# Patient Record
Sex: Female | Born: 1955 | Race: White | Hispanic: No | Marital: Single | Smoking: Never smoker
Health system: Southern US, Community
[De-identification: ages and names within clinical notes are randomized; demographics above are authoritative.]

## PROBLEM LIST (undated history)

## (undated) DIAGNOSIS — M1990 Unspecified osteoarthritis, unspecified site: Secondary | ICD-10-CM

## (undated) DIAGNOSIS — F329 Major depressive disorder, single episode, unspecified: Secondary | ICD-10-CM

## (undated) DIAGNOSIS — J449 Chronic obstructive pulmonary disease, unspecified: Secondary | ICD-10-CM

## (undated) DIAGNOSIS — F419 Anxiety disorder, unspecified: Secondary | ICD-10-CM

## (undated) DIAGNOSIS — I10 Essential (primary) hypertension: Secondary | ICD-10-CM

## (undated) DIAGNOSIS — G8929 Other chronic pain: Secondary | ICD-10-CM

## (undated) DIAGNOSIS — F32A Depression, unspecified: Secondary | ICD-10-CM

## (undated) DIAGNOSIS — M545 Low back pain, unspecified: Secondary | ICD-10-CM

## (undated) HISTORY — PX: BACK SURGERY: SHX140

## (undated) HISTORY — DX: Major depressive disorder, single episode, unspecified: F32.9

## (undated) HISTORY — PX: ABDOMINAL HYSTERECTOMY: SHX81

## (undated) HISTORY — DX: Depression, unspecified: F32.A

## (undated) HISTORY — DX: Other chronic pain: G89.29

## (undated) HISTORY — PX: COLPORRHAPHY: SHX921

## (undated) HISTORY — DX: Low back pain, unspecified: M54.50

## (undated) HISTORY — DX: Low back pain: M54.5

## (undated) HISTORY — DX: Unspecified osteoarthritis, unspecified site: M19.90

## (undated) HISTORY — DX: Anxiety disorder, unspecified: F41.9

## (undated) HISTORY — DX: Chronic obstructive pulmonary disease, unspecified: J44.9

## (undated) HISTORY — DX: Essential (primary) hypertension: I10

## (undated) HISTORY — PX: APPENDECTOMY: SHX54

---

## 2000-12-21 ENCOUNTER — Emergency Department (HOSPITAL_COMMUNITY): Admission: EM | Admit: 2000-12-21 | Discharge: 2000-12-22 | Payer: Self-pay | Admitting: Emergency Medicine

## 2000-12-21 ENCOUNTER — Encounter: Payer: Self-pay | Admitting: Emergency Medicine

## 2009-03-16 ENCOUNTER — Emergency Department (HOSPITAL_COMMUNITY): Admission: EM | Admit: 2009-03-16 | Discharge: 2009-03-16 | Payer: Self-pay | Admitting: Emergency Medicine

## 2013-04-24 ENCOUNTER — Ambulatory Visit (INDEPENDENT_AMBULATORY_CARE_PROVIDER_SITE_OTHER): Payer: Medicare Other | Admitting: Urology

## 2013-04-24 ENCOUNTER — Ambulatory Visit: Payer: Self-pay | Admitting: Urology

## 2013-04-24 DIAGNOSIS — N3946 Mixed incontinence: Secondary | ICD-10-CM

## 2013-04-24 DIAGNOSIS — N952 Postmenopausal atrophic vaginitis: Secondary | ICD-10-CM

## 2013-04-24 DIAGNOSIS — R350 Frequency of micturition: Secondary | ICD-10-CM

## 2013-04-24 DIAGNOSIS — R3989 Other symptoms and signs involving the genitourinary system: Secondary | ICD-10-CM

## 2013-04-24 DIAGNOSIS — R351 Nocturia: Secondary | ICD-10-CM

## 2013-06-02 ENCOUNTER — Encounter: Payer: Self-pay | Admitting: *Deleted

## 2013-06-11 ENCOUNTER — Encounter: Payer: Self-pay | Admitting: *Deleted

## 2013-06-11 ENCOUNTER — Encounter: Payer: Self-pay | Admitting: Obstetrics and Gynecology

## 2014-07-07 ENCOUNTER — Encounter (HOSPITAL_COMMUNITY): Payer: Self-pay | Admitting: Emergency Medicine

## 2014-07-07 ENCOUNTER — Emergency Department (HOSPITAL_COMMUNITY)
Admission: EM | Admit: 2014-07-07 | Discharge: 2014-07-07 | Disposition: A | Discharge status: Home / Self Care | Payer: Medicare Other | Attending: Emergency Medicine | Admitting: Emergency Medicine

## 2014-07-07 ENCOUNTER — Emergency Department (HOSPITAL_COMMUNITY): Payer: Medicare Other

## 2014-07-07 DIAGNOSIS — Z79899 Other long term (current) drug therapy: Secondary | ICD-10-CM | POA: Insufficient documentation

## 2014-07-07 DIAGNOSIS — F329 Major depressive disorder, single episode, unspecified: Secondary | ICD-10-CM | POA: Insufficient documentation

## 2014-07-07 DIAGNOSIS — W1809XA Striking against other object with subsequent fall, initial encounter: Secondary | ICD-10-CM | POA: Diagnosis not present

## 2014-07-07 DIAGNOSIS — S0993XA Unspecified injury of face, initial encounter: Secondary | ICD-10-CM | POA: Insufficient documentation

## 2014-07-07 DIAGNOSIS — S0181XA Laceration without foreign body of other part of head, initial encounter: Secondary | ICD-10-CM

## 2014-07-07 DIAGNOSIS — W010XXA Fall on same level from slipping, tripping and stumbling without subsequent striking against object, initial encounter: Secondary | ICD-10-CM | POA: Insufficient documentation

## 2014-07-07 DIAGNOSIS — S0180XA Unspecified open wound of other part of head, initial encounter: Secondary | ICD-10-CM | POA: Diagnosis not present

## 2014-07-07 DIAGNOSIS — Y9389 Activity, other specified: Secondary | ICD-10-CM | POA: Insufficient documentation

## 2014-07-07 DIAGNOSIS — S8000XA Contusion of unspecified knee, initial encounter: Secondary | ICD-10-CM | POA: Insufficient documentation

## 2014-07-07 DIAGNOSIS — Y929 Unspecified place or not applicable: Secondary | ICD-10-CM | POA: Diagnosis not present

## 2014-07-07 DIAGNOSIS — G8929 Other chronic pain: Secondary | ICD-10-CM | POA: Diagnosis not present

## 2014-07-07 DIAGNOSIS — J449 Chronic obstructive pulmonary disease, unspecified: Secondary | ICD-10-CM | POA: Diagnosis not present

## 2014-07-07 DIAGNOSIS — J4489 Other specified chronic obstructive pulmonary disease: Secondary | ICD-10-CM | POA: Insufficient documentation

## 2014-07-07 DIAGNOSIS — F3289 Other specified depressive episodes: Secondary | ICD-10-CM | POA: Diagnosis not present

## 2014-07-07 DIAGNOSIS — M129 Arthropathy, unspecified: Secondary | ICD-10-CM | POA: Insufficient documentation

## 2014-07-07 DIAGNOSIS — F411 Generalized anxiety disorder: Secondary | ICD-10-CM | POA: Insufficient documentation

## 2014-07-07 DIAGNOSIS — I10 Essential (primary) hypertension: Secondary | ICD-10-CM | POA: Insufficient documentation

## 2014-07-07 DIAGNOSIS — S199XXA Unspecified injury of neck, initial encounter: Secondary | ICD-10-CM

## 2014-07-07 DIAGNOSIS — W19XXXA Unspecified fall, initial encounter: Secondary | ICD-10-CM

## 2014-07-07 MED ORDER — HYDROMORPHONE HCL PF 1 MG/ML IJ SOLN
1.0000 mg | Freq: Once | INTRAMUSCULAR | Status: AC
  Administered 2014-07-07: 1 mg via INTRAMUSCULAR
  Filled 2014-07-07: qty 1

## 2014-07-07 MED ORDER — IBUPROFEN 600 MG PO TABS: 600.0000 mg | ORAL_TABLET | Freq: Three times a day (TID) | ORAL | Status: AC | PRN

## 2014-07-07 NOTE — ED Notes (Signed)
PA at bedside.

## 2014-07-07 NOTE — ED Provider Notes (Signed)
CSN: [id number]     Arrival date & time 07/07/14  1226 History   First MD Initiated Contact with Patient 07/07/14 1516     Chief Complaint  Patient presents with  . Fall  . Laceration     (Consider location/radiation/quality/duration/timing/severity/associated sxs/prior Treatment) HPI Comments: Christine Galvan is a 58 y.o. female with a PMHx of anxiety, arthritis, COPD, depression, HTN, chronic back pain on Opana, who presents today with complaints of a fall around noon today. She states that she was in the hallway leaving the hospital after visiting her daughter who is in the ICU, and states that she fell after tripping, and hit her face on the floor as well as both knees. She states that she now has a headache and a laceration to her for head with knee soreness. She states that the pain is moderate in severity, constant, achy, nonradiating, with no known aggravating or alleviating factors. She is on any blood thinning medications. She denies any loss of consciousness. She states that she was ambulatory after the event. Denies any neck pain or back pain, chest pain, rhinorrhea, ear pain, abdominal pain, nausea, vomiting, vertigo, vision changes, or weakness. Denies any cauda equina symptoms. Denies feeling lightheaded at the time. States that she has been "running herself ragged" with her daughter in the hospital. Her last dose of Opana was at 7 AM this morning. She is up-to-date on her tetanus shot.  Patient is a 58 y.o. female presenting with fall and skin laceration. The history is provided by the patient. No language interpreter was used.  Fall This is a new problem. The current episode started today. The problem has been unchanged. Associated symptoms include arthralgias (L knee) and headaches. Pertinent negatives include no abdominal pain, chest pain, fever, joint swelling, myalgias, nausea, neck pain, numbness, vertigo, visual change, vomiting or weakness. Nothing aggravates the symptoms. She  has tried nothing for the symptoms. The treatment provided no relief.  Laceration   Past Medical History  Diagnosis Date  . Anxiety   . Arthritis   . COPD (chronic obstructive pulmonary disease)   . Depression   . Hypertension   . Lumbago   . Chronic pain    Past Surgical History  Procedure Laterality Date  . Colporrhaphy      repair of cystocele  . Appendectomy    . Back surgery    . Cesarean section    . Abdominal hysterectomy     No family history on file. History  Substance Use Topics  . Smoking status: Never Smoker   . Smokeless tobacco: Never Used  . Alcohol Use: No   OB History   Grav Para Term Preterm Abortions TAB SAB Ect Mult Living                 Review of Systems  Constitutional: Negative for fever.  HENT: Positive for facial swelling. Negative for drooling, ear discharge, ear pain, hearing loss, rhinorrhea and trouble swallowing.   Eyes: Negative for photophobia and visual disturbance.  Respiratory: Negative for shortness of breath.   Cardiovascular: Negative for chest pain.  Gastrointestinal: Negative for nausea, vomiting and abdominal pain.  Musculoskeletal: Positive for arthralgias (L knee). Negative for back pain, gait problem, joint swelling, myalgias, neck pain and neck stiffness.  Skin: Positive for wound.  Neurological: Positive for headaches. Negative for dizziness, vertigo, syncope, weakness, light-headedness and numbness.  Hematological: Does not bruise/bleed easily.  Psychiatric/Behavioral: Negative for confusion.  10 Systems reviewed and are negative for  acute change except as noted in the HPI.     Allergies  Bee venom  Home Medications   Prior to Admission medications   Medication Sig Start Date End Date Taking? Authorizing Provider  albuterol (PROVENTIL HFA;VENTOLIN HFA) 108 (90 BASE) MCG/ACT inhaler Inhale 1 puff into the lungs every 6 (six) hours as needed for wheezing or shortness of breath.   Yes Historical Provider, MD    ALPRAZolam Prudy Feeler(XANAX) 1 MG tablet Take 1 mg by mouth 3 (three) times daily as needed for anxiety.   Yes Historical Provider, MD  EPINEPHrine (EPIPEN 2-PAK IJ) Inject 1 Applicatorful as directed daily as needed (allergic reaction).    Yes Historical Provider, MD  lisinopril (PRINIVIL,ZESTRIL) 10 MG tablet Take 10 mg by mouth daily.   Yes Historical Provider, MD  oxymorphone (OPANA ER) 40 MG 12 hr tablet Take 40 mg by mouth every 12 (twelve) hours.   Yes Historical Provider, MD  Vilazodone HCl (VIIBRYD) 40 MG TABS Take 40 mg by mouth daily.   Yes Historical Provider, MD  ibuprofen (ADVIL,MOTRIN) 600 MG tablet Take 1 tablet (600 mg total) by mouth every 8 (eight) hours as needed for fever, headache, moderate pain or cramping. 07/07/14   Martha Soltys Strupp Camprubi-Soms, PA-C   BP 124/71  Pulse 66  Temp(Src) 98.1 F (36.7 C) (Oral)  Resp 20  Wt 234 lb (106.142 kg)  SpO2 95% Physical Exam  Nursing note and vitals reviewed. Constitutional: She is oriented to person, place, and time. Vital signs are normal. She appears well-developed and well-nourished. No distress.  VSS  HENT:  Head: Normocephalic. Head is with laceration.    Mouth/Throat: Oropharynx is clear and moist and mucous membranes are normal. Normal dentition.  Laceration approx 0.5cm in length across central forehead with surrounding swelling and erythema, bleeding controlled with pressure. No bony deformity or crepitus. Nose clear Mouth clear without dentitia looseness, poor oral dentitia at baseline  Eyes: Conjunctivae and EOM are normal. Pupils are equal, round, and reactive to light. Right eye exhibits no discharge. Left eye exhibits no discharge.  PERRL, EOMI, conjunctiva noninjected  Neck: Normal range of motion. Neck supple. No spinous process tenderness and no muscular tenderness present. No rigidity. Normal range of motion present.  FROM intact without any spinous process or paracervical muscle tenderness to palpation. No  meningeal signs.  Cardiovascular: Normal rate, regular rhythm, normal heart sounds and intact distal pulses.  Exam reveals no gallop and no friction rub.   No murmur heard. Pulmonary/Chest: Effort normal and breath sounds normal. No respiratory distress. She has no decreased breath sounds. She has no wheezes. She has no rhonchi. She has no rales.  Abdominal: Soft. Normal appearance and bowel sounds are normal. She exhibits no distension. There is no tenderness. There is no rigidity, no rebound and no guarding.  Musculoskeletal:       Left knee: She exhibits decreased range of motion, swelling and erythema (trace, over patella). She exhibits no effusion, no deformity, no laceration, normal alignment, no LCL laxity, normal patellar mobility and no MCL laxity. Tenderness found.  Left knee with mild swelling over patella, and trace erythema over this area with no abrasions or lacerations. Tender to palpation over the patella. No abnormal patellar mobility or alignment. No patellar ballottement. No laxity with varus or valgus stress. Range of motion limited secondary to pain. Sensations grossly intact all extremities. Strength 5/5 in all extremities. Right knee without swelling, decreased range of motion, tenderness, or deformity. There is a small  area of erythema over the patella where she landed on, but no lacerations or abrasions.  Neurological: She is alert and oriented to person, place, and time. She has normal strength. No cranial nerve deficit or sensory deficit.  CN 2-12 grossly intact without cerebellar signs. Strength 5/5 in all extremities, sensation grossly intact.  Skin: Skin is warm and dry. Laceration noted. No rash noted. There is erythema.     Laceration approx 0.5cm across central forehead. Erythema to b/l knees over patellas  Psychiatric: She has a normal mood and affect.    ED Course  LACERATION REPAIR Date/Time: 07/07/2014 4:20 PM Performed by: CAMPRUBI-SOMS, Kaiulani Sitton  STRUPP Authorized by: Ramond Marrow Consent: Verbal consent obtained. Risks and benefits: risks, benefits and alternatives were discussed Consent given by: patient Patient understanding: patient states understanding of the procedure being performed Patient consent: the patient's understanding of the procedure matches consent given Patient identity confirmed: verbally with patient Body area: head/neck Location details: forehead Laceration length: 0.5 cm Foreign bodies: no foreign bodies Tendon involvement: none Nerve involvement: none Vascular damage: no Preparation: Patient was prepped and draped in the usual sterile fashion. Irrigation solution: saline Irrigation method: syringe Amount of cleaning: standard Debridement: none Degree of undermining: none Skin closure: glue and Steri-Strips Approximation: close Approximation difficulty: simple Patient tolerance: Patient tolerated the procedure well with no immediate complications.   (including critical care time) Labs Review Labs Reviewed - No data to display  Imaging Review Ct Head Wo Contrast  07/07/2014   CLINICAL DATA:  Tripped and fell. Laceration to forehead and nose. Headache and soreness to face.  EXAM: CT HEAD WITHOUT CONTRAST  CT MAXILLOFACIAL WITHOUT CONTRAST  CT CERVICAL SPINE WITHOUT CONTRAST  TECHNIQUE: Multidetector CT imaging of the head, cervical spine, and maxillofacial structures were performed using the standard protocol without intravenous contrast. Multiplanar CT image reconstructions of the cervical spine and maxillofacial structures were also generated.  COMPARISON:  None.  FINDINGS: CT HEAD FINDINGS  There is no evidence for acute hemorrhage, hydrocephalus, mass lesion, or abnormal extra-axial fluid collection. No definite CT evidence for acute infarction. The visualized paranasal sinuses and mastoid air cells are clear. Soft tissue gas in the frontal scalp consistent with the reported history  of laceration.  CT MAXILLOFACIAL FINDINGS  There is no evidence for facial bone fracture. The nasal bones are intact. No medial or inferior orbital wall fracture. No maxillary sinus fracture. Zygomatic arches are intact. The mandible is intact in the temporomandibular joints are located. No air-fluid levels in the sphenoid sinuses. No evidence for fluid in the mastoid air cells.  CT CERVICAL SPINE FINDINGS  Imaging was obtained from the skullbase through the T2-3 interspace. There is no evidence for fracture. No subluxation. Intervertebral disc spaces are preserved. Facets are well aligned bilaterally. Left-sided foraminal encroachment is seen at C3-4 secondary to uncinate spurring and facet over growth. There is fairly diffuse facet arthropathy bilaterally. No prevertebral soft tissue swelling. Slight tonsillar asymmetry noted. Straightening of the normal cervical lordosis is evident.  IMPRESSION: 1. No acute intracranial abnormality. Normal CT evaluation of the brain. 2. Tiny gas bubbles in the frontal scalp consistent with the reported history of laceration. 3. No evidence for facial bone fracture. 4. Degenerative changes in the cervical spine without fracture.   Electronically Signed   By: Kennith Center M.D.   On: 07/07/2014 14:05   Ct Cervical Spine Wo Contrast  07/07/2014   CLINICAL DATA:  Tripped and fell. Laceration to forehead and nose. Headache  and soreness to face.  EXAM: CT HEAD WITHOUT CONTRAST  CT MAXILLOFACIAL WITHOUT CONTRAST  CT CERVICAL SPINE WITHOUT CONTRAST  TECHNIQUE: Multidetector CT imaging of the head, cervical spine, and maxillofacial structures were performed using the standard protocol without intravenous contrast. Multiplanar CT image reconstructions of the cervical spine and maxillofacial structures were also generated.  COMPARISON:  None.  FINDINGS: CT HEAD FINDINGS  There is no evidence for acute hemorrhage, hydrocephalus, mass lesion, or abnormal extra-axial fluid collection. No  definite CT evidence for acute infarction. The visualized paranasal sinuses and mastoid air cells are clear. Soft tissue gas in the frontal scalp consistent with the reported history of laceration.  CT MAXILLOFACIAL FINDINGS  There is no evidence for facial bone fracture. The nasal bones are intact. No medial or inferior orbital wall fracture. No maxillary sinus fracture. Zygomatic arches are intact. The mandible is intact in the temporomandibular joints are located. No air-fluid levels in the sphenoid sinuses. No evidence for fluid in the mastoid air cells.  CT CERVICAL SPINE FINDINGS  Imaging was obtained from the skullbase through the T2-3 interspace. There is no evidence for fracture. No subluxation. Intervertebral disc spaces are preserved. Facets are well aligned bilaterally. Left-sided foraminal encroachment is seen at C3-4 secondary to uncinate spurring and facet over growth. There is fairly diffuse facet arthropathy bilaterally. No prevertebral soft tissue swelling. Slight tonsillar asymmetry noted. Straightening of the normal cervical lordosis is evident.  IMPRESSION: 1. No acute intracranial abnormality. Normal CT evaluation of the brain. 2. Tiny gas bubbles in the frontal scalp consistent with the reported history of laceration. 3. No evidence for facial bone fracture. 4. Degenerative changes in the cervical spine without fracture.   Electronically Signed   By: Kennith Center M.D.   On: 07/07/2014 14:05   Dg Knee Complete 4 Views Left  07/07/2014   CLINICAL DATA:  Pain after a fall.  Lacerations.  EXAM: LEFT KNEE - COMPLETE 4+ VIEW  COMPARISON:  None.  FINDINGS: No acute osseous or joint abnormality. Small radiopaque structure is seen along the lateral aspect of the distal femur. Mild medial compartment osteophytosis.  IMPRESSION: 1. No acute osseous or joint abnormality. 2. Radiopaque structure in the soft tissues lateral to the distal femur may be pre-existing. 3. Trace medial compartment  osteophytosis.   Electronically Signed   By: Leanna Battles M.D.   On: 07/07/2014 13:20   Dg Knee Complete 4 Views Right  07/07/2014   CLINICAL DATA:  Fall and laceration.  EXAM: RIGHT KNEE - COMPLETE 4+ VIEW  COMPARISON:  None.  FINDINGS: Negative for a fracture or dislocation. No significant joint effusion. Alignment of the right knee is normal. Mild degenerative changes in the patellofemoral compartment and medial knee compartment.  IMPRESSION: No acute bone abnormality in the right knee.   Electronically Signed   By: Richarda Overlie M.D.   On: 07/07/2014 13:22   Ct Maxillofacial Wo Cm  07/07/2014   CLINICAL DATA:  Tripped and fell. Laceration to forehead and nose. Headache and soreness to face.  EXAM: CT HEAD WITHOUT CONTRAST  CT MAXILLOFACIAL WITHOUT CONTRAST  CT CERVICAL SPINE WITHOUT CONTRAST  TECHNIQUE: Multidetector CT imaging of the head, cervical spine, and maxillofacial structures were performed using the standard protocol without intravenous contrast. Multiplanar CT image reconstructions of the cervical spine and maxillofacial structures were also generated.  COMPARISON:  None.  FINDINGS: CT HEAD FINDINGS  There is no evidence for acute hemorrhage, hydrocephalus, mass lesion, or abnormal extra-axial fluid collection.  No definite CT evidence for acute infarction. The visualized paranasal sinuses and mastoid air cells are clear. Soft tissue gas in the frontal scalp consistent with the reported history of laceration.  CT MAXILLOFACIAL FINDINGS  There is no evidence for facial bone fracture. The nasal bones are intact. No medial or inferior orbital wall fracture. No maxillary sinus fracture. Zygomatic arches are intact. The mandible is intact in the temporomandibular joints are located. No air-fluid levels in the sphenoid sinuses. No evidence for fluid in the mastoid air cells.  CT CERVICAL SPINE FINDINGS  Imaging was obtained from the skullbase through the T2-3 interspace. There is no evidence for  fracture. No subluxation. Intervertebral disc spaces are preserved. Facets are well aligned bilaterally. Left-sided foraminal encroachment is seen at C3-4 secondary to uncinate spurring and facet over growth. There is fairly diffuse facet arthropathy bilaterally. No prevertebral soft tissue swelling. Slight tonsillar asymmetry noted. Straightening of the normal cervical lordosis is evident.  IMPRESSION: 1. No acute intracranial abnormality. Normal CT evaluation of the brain. 2. Tiny gas bubbles in the frontal scalp consistent with the reported history of laceration. 3. No evidence for facial bone fracture. 4. Degenerative changes in the cervical spine without fracture.   Electronically Signed   By: Kennith Center M.D.   On: 07/07/2014 14:05     EKG Interpretation None      MDM   Final diagnoses:  Facial laceration, initial encounter  Knee contusion, unspecified laterality, initial encounter  Fall, initial encounter     58y/o female here s/p fall in hallway. Tripped trying to keep up with family members. No LOC. CT head, face, and neck obtained and were neg. Xrays bilateral knees neg. Will give 1mg  IM dilaudid since pt is chronic pain pt and has not taken regular dose of opana this afternoon, and has had a fall. Will fix lac with dermabond and place steristrips over small nonbleeding area next to lac. D/c home with instructions to use tylenol or motrin for pain in addition to her home opana medication. Discussed proper care of dermabond, and s/sx of infection to watch for. Discussed f/up in 5 days with PCP for wound check. I explained the diagnosis and have given explicit precautions to return to the ER including for any other new or worsening symptoms. The patient understands and accepts the medical plan as it's been dictated and I have answered their questions. Discharge instructions concerning home care and prescriptions have been given. The patient is STABLE and is discharged to home in good  condition.  BP 124/71  Pulse 66  Temp(Src) 98.1 F (36.7 C) (Oral)  Resp 20  Wt 234 lb (106.142 kg)  SpO2 95%  Meds ordered this encounter  Medications  . HYDROmorphone (DILAUDID) injection 1 mg    Sig:   . ibuprofen (ADVIL,MOTRIN) 600 MG tablet    Sig: Take 1 tablet (600 mg total) by mouth every 8 (eight) hours as needed for fever, headache, moderate pain or cramping.    Dispense:  30 tablet    Refill:  0    Order Specific Question:  Supervising Provider    Answer:  Vida Roller [address] Camprubi-Soms, PA-C 07/07/14 1645

## 2014-07-07 NOTE — ED Notes (Addendum)
Pt presents to department for evaluation of fall. Pt here visiting family member, states she tripped and fell in hallway. Lacerations noted to forehead and nose, bleeding controlled. Also reports knee redness and pain. Pt is alert and oriented x4. Denies LOC. Pt reports headache and soreness to face.

## 2014-07-07 NOTE — Discharge Instructions (Signed)
Keep wound and clean with mild soap and water but do not get wet for the next 24 hours. Keep area covered with a bandage, keep bandage dry, and do not submerge in water for 24 hours. DO NOT USE OINTMENTS ON THIS AREA. Ice for additional pain relief and swelling. Alternate between Ibuprofen and Tylenol for additional pain relief as well as your home pain medications. [facility] up with your primary care doctor or [facility] in approximately 5-7 days for wound recheck. Monitor area for signs of infection to include, but not limited to: increasing pain, redness, drainage/pus, or swelling. Return to emergency department for emergent changing or worsening symptoms.   Facial Laceration A facial laceration is a cut on the face. These injuries can be painful and cause bleeding. Some cuts may need to be closed with stitches (sutures), skin adhesive strips, or wound glue. Cuts usually heal quickly but can leave a scar. It can take 1-2 years for the scar to go away completely. HOME CARE   Only take medicines as told by your doctor.  Follow your doctor's instructions for wound care. For Stitches:  Keep the cut clean and dry.  If you have a bandage (dressing), change it at least once a day. Change the bandage if it gets wet or dirty, or as told by your doctor.  Wash the cut with soap and water 2 times a day. Rinse the cut with water. Pat it dry with a clean towel.  Put a thin layer of medicated cream on the cut as told by your doctor.  You may shower after the first 24 hours. Do not soak the cut in water until the stitches are removed.  Have your stitches removed as told by your doctor.  Do not wear any makeup until a few days after your stitches are removed. For Skin Adhesive Strips:  Keep the cut clean and dry.  Do not get the strips wet. You may take a bath, but be careful to keep the cut dry.  If the cut gets wet, pat it dry with a clean towel.  The strips will fall off on  their own. Do not remove the strips that are still stuck to the cut. For Wound Glue:  You may shower or take baths. Do not soak or scrub the cut. Do not swim. Avoid heavy sweating until the glue falls off on its own. After a shower or bath, pat the cut dry with a clean towel.  Do not put medicine or makeup on your cut until the glue falls off.  If you have a bandage, do not put tape over the glue.  Avoid lots of sunlight or tanning lamps until the glue falls off.  The glue will fall off on its own in 5-10 days. Do not pick at the glue. After Healing: Put sunscreen on the cut for the first year to reduce your scar. GET HELP RIGHT AWAY IF:   Your cut area gets red, painful, or puffy (swollen).  You see a yellowish-white fluid (pus) coming from the cut.  You have chills or a fever. MAKE SURE YOU:   Understand these instructions.  Will watch your condition.  Will get help right away if you are not doing well or get worse. Document Released: 04/23/2008 Document Revised: 08/26/2013 Document Reviewed: 06/18/2013 [facility] Patient Information 2015 [place]. This information is not intended to replace advice given to you by your health care provider. Make sure you discuss any  questions you have with your health care provider.  Cryotherapy Cryotherapy means treatment with cold. Ice or gel packs can be used to reduce both pain and swelling. Ice is the most helpful within the first 24 to 48 hours after an injury or flare-up from overusing a muscle or joint. Sprains, strains, spasms, burning pain, shooting pain, and aches can all be eased with ice. Ice can also be used when recovering from surgery. Ice is effective, has very few side effects, and is safe for most people to use. PRECAUTIONS  Ice is not a safe treatment option for people with:  Raynaud phenomenon. This is a condition affecting small blood vessels in the extremities. Exposure to cold may cause your problems to  return.  Cold hypersensitivity. There are many forms of cold hypersensitivity, including:  Cold urticaria. Red, itchy hives appear on the skin when the tissues begin to warm after being iced.  Cold erythema. This is a red, itchy rash caused by exposure to cold.  Cold hemoglobinuria. Red blood cells break down when the tissues begin to warm after being iced. The hemoglobin that carry oxygen are passed into the urine because they cannot combine with blood proteins fast enough.  Numbness or altered sensitivity in the area being iced. If you have any of the following conditions, do not use ice until you have discussed cryotherapy with your caregiver:  Heart conditions, such as arrhythmia, angina, or chronic heart disease.  High blood pressure.  Healing wounds or open skin in the area being iced.  Current infections.  Rheumatoid arthritis.  Poor circulation.  Diabetes. Ice slows the blood flow in the region it is applied. This is beneficial when trying to stop inflamed tissues from spreading irritating chemicals to surrounding tissues. However, if you expose your skin to cold temperatures for too long or without the proper protection, you can damage your skin or nerves. Watch for signs of skin damage due to cold. HOME CARE INSTRUCTIONS Follow these tips to use ice and cold packs safely.  Place a dry or damp towel between the ice and skin. A damp towel will cool the skin more quickly, so you may need to shorten the time that the ice is used.  For a more rapid response, add gentle compression to the ice.  Ice for no more than 10 to 20 minutes at a time. The bonier the area you are icing, the less time it will take to get the benefits of ice.  Check your skin after 5 minutes to make sure there are no signs of a poor response to cold or skin damage.  Rest 20 minutes or more between uses.  Once your skin is numb, you can end your treatment. You can test numbness by very lightly touching  your skin. The touch should be so light that you do not see the skin dimple from the pressure of your fingertip. When using ice, most people will feel these normal sensations in this order: cold, burning, aching, and numbness.  Do not use ice on someone who cannot communicate their responses to pain, such as small children or people with dementia. HOW TO MAKE AN ICE PACK Ice packs are the most common way to use ice therapy. Other methods include ice massage, ice baths, and cryosprays. Muscle creams that cause a cold, tingly feeling do not offer the same benefits that ice offers and should not be used as a substitute unless recommended by your caregiver. To make an ice pack, do  one of the following:  Place crushed ice or a bag of frozen vegetables in a sealable plastic bag. Squeeze out the excess air. Place this bag inside another plastic bag. Slide the bag into a pillowcase or place a damp towel between your skin and the bag.  Mix 3 parts water with 1 part rubbing alcohol. Freeze the mixture in a sealable plastic bag. When you remove the mixture from the freezer, it will be slushy. Squeeze out the excess air. Place this bag inside another plastic bag. Slide the bag into a pillowcase or place a damp towel between your skin and the bag. SEEK MEDICAL CARE IF:  You develop white spots on your skin. This may give the skin a blotchy (mottled) appearance.  Your skin turns blue or pale.  Your skin becomes waxy or hard.  Your swelling gets worse. MAKE SURE YOU:   Understand these instructions.  Will watch your condition.  Will get help right away if you are not doing well or get worse. Document Released: 07/02/2011 Document Revised: 03/22/2014 Document Reviewed: 07/02/2011 [facility] Patient Information 2015 [place], [place]. This information is not intended to replace advice given to you by your health care provider. Make sure you discuss any questions you have with your health care  provider.  Stitches, Staples, or Skin Adhesive Strips  Stitches (sutures), staples, and skin adhesive strips hold the skin together as it heals. They will usually be in place for 7 days or less. HOME CARE  Wash your hands with soap and water before and after you touch your wound.  Only take medicine as told by your doctor.  Cover your wound only if your doctor told you to. Otherwise, leave it open to air.  Do not get your stitches wet or dirty. If they get dirty, dab them gently with a clean washcloth. Wet the washcloth with soapy water. Do not rub. Pat them dry gently.  Do not put medicine or medicated cream on your stitches unless your doctor told you to.  Do not take out your own stitches or staples. Skin adhesive strips will fall off by themselves.  Do not pick at the wound. Picking can cause an infection.  Do not miss your follow-up appointment.  If you have problems or questions, call your doctor. GET HELP RIGHT AWAY IF:   You have a temperature by mouth above 102 F (38.9 C), not controlled by medicine.  You have chills.  You have redness or pain around your stitches.  There is puffiness (swelling) around your stitches.  You notice fluid (drainage) from your stitches.  There is a bad smell coming from your wound. MAKE SURE YOU:  Understand these instructions.  Will watch your condition.  Will get help if you are not doing well or get worse. Document Released: 09/02/2009 Document Revised: 01/28/2012 Document Reviewed: 09/02/2009 [facility] Patient Information 2015 [place], [place]. This information is not intended to replace advice given to you by your health care provider. Make sure you discuss any questions you have with your health care provider.  Sutured Wound Care Sutures are stitches that can be used to close wounds. Caring for your wound can help stop infection and lessen pain. HOME CARE   Rest and raise (elevate) the injured area until the pain and  puffiness (swelling) go away.  Only take medicines as told by your doctor.  Clean the wound gently with mild soap and water once a day after the first 2 days. Rinse off the soap. Dennie Bible  the area dry with a clean towel. Do not rub the wound.  Change the bandage (dressing) as told by your doctor. If the bandage sticks, soak it off with soapy water. Stop using a bandage after 2 days or after the wound stops leaking fluid.  Put cream on the wound as told by your doctor.  Do not stretch the wound.  Drink enough fluids to keep your pee (urine) clear or pale yellow.  See your doctor to have the sutures removed.  Use sunscreen or sunblock on the wound after it heals. GET HELP RIGHT AWAY IF:   Your wound gets red, puffy, hot, or tender.  You have more pain in the wound.  You have a red streak that goes away from the wound.  You see yellowish-white fluid (pus) coming out of the wound.  You have a fever.  You have chills and start to shake.  You notice a bad smell coming from the wound.  Your wound will not stop bleeding. MAKE SURE YOU:   Understand these instructions.  Will watch your condition.  Will get help right away if you are not doing well or get worse. Document Released: 04/23/2008 Document Revised: 01/28/2012 Document Reviewed: 03/11/2011 [facility] Patient Information 2015 [place], [place]. This information is not intended to replace advice given to you by your health care provider. Make sure you discuss any questions you have with your health care provider.

## 2014-07-08 NOTE — ED Provider Notes (Signed)
Medical screening examination/treatment/procedure(s) were performed by non-physician practitioner and as supervising physician I was immediately available for consultation/collaboration.   Emit Kuenzel T Amena Dockham, MD 07/08/14 2250 

## 2015-08-10 IMAGING — CR DG KNEE COMPLETE 4+V*L*
4 series · 4 of 4 positions shown · non-contrast
Comparison: None.

CLINICAL DATA: Pain after a fall.  Lacerations.

EXAM:
LEFT KNEE - COMPLETE 4+ VIEW

[t knee ap left]
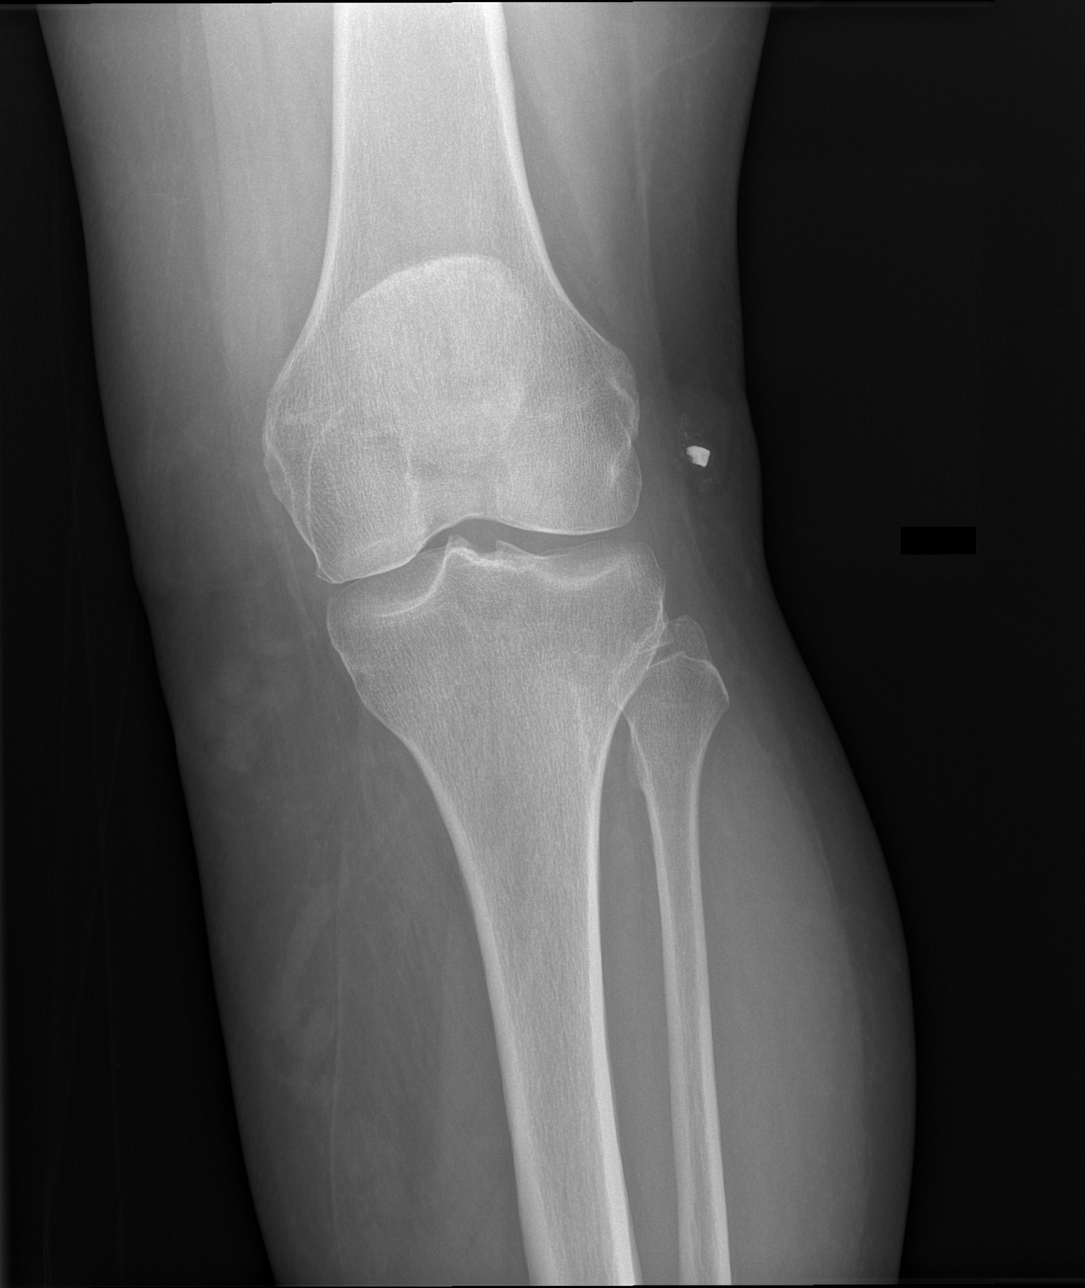

[t knee obl left (1 of 2)]
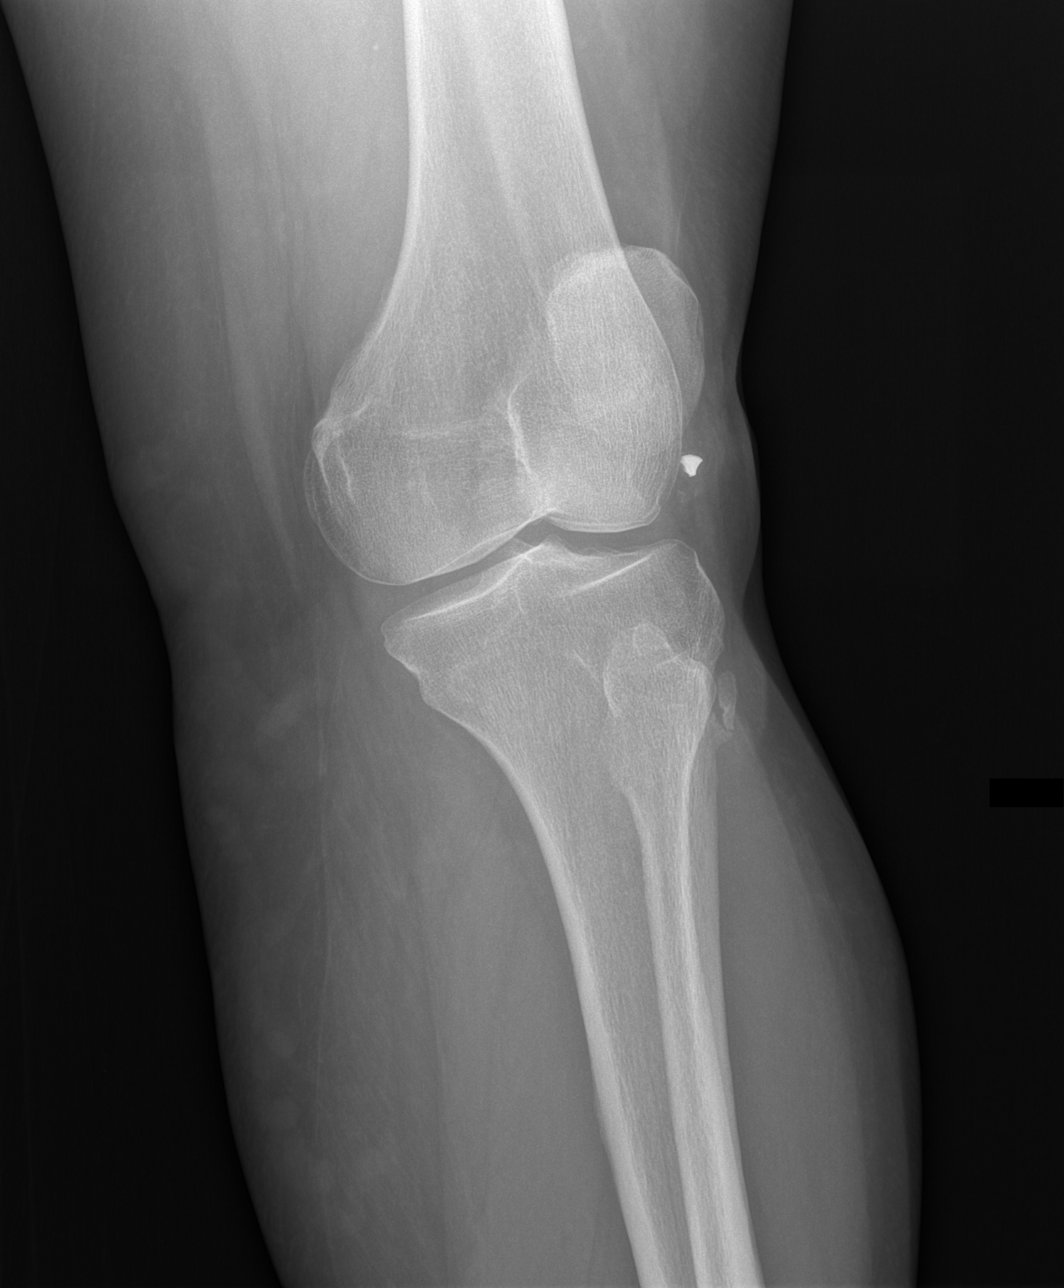

[t knee obl left (2 of 2)]
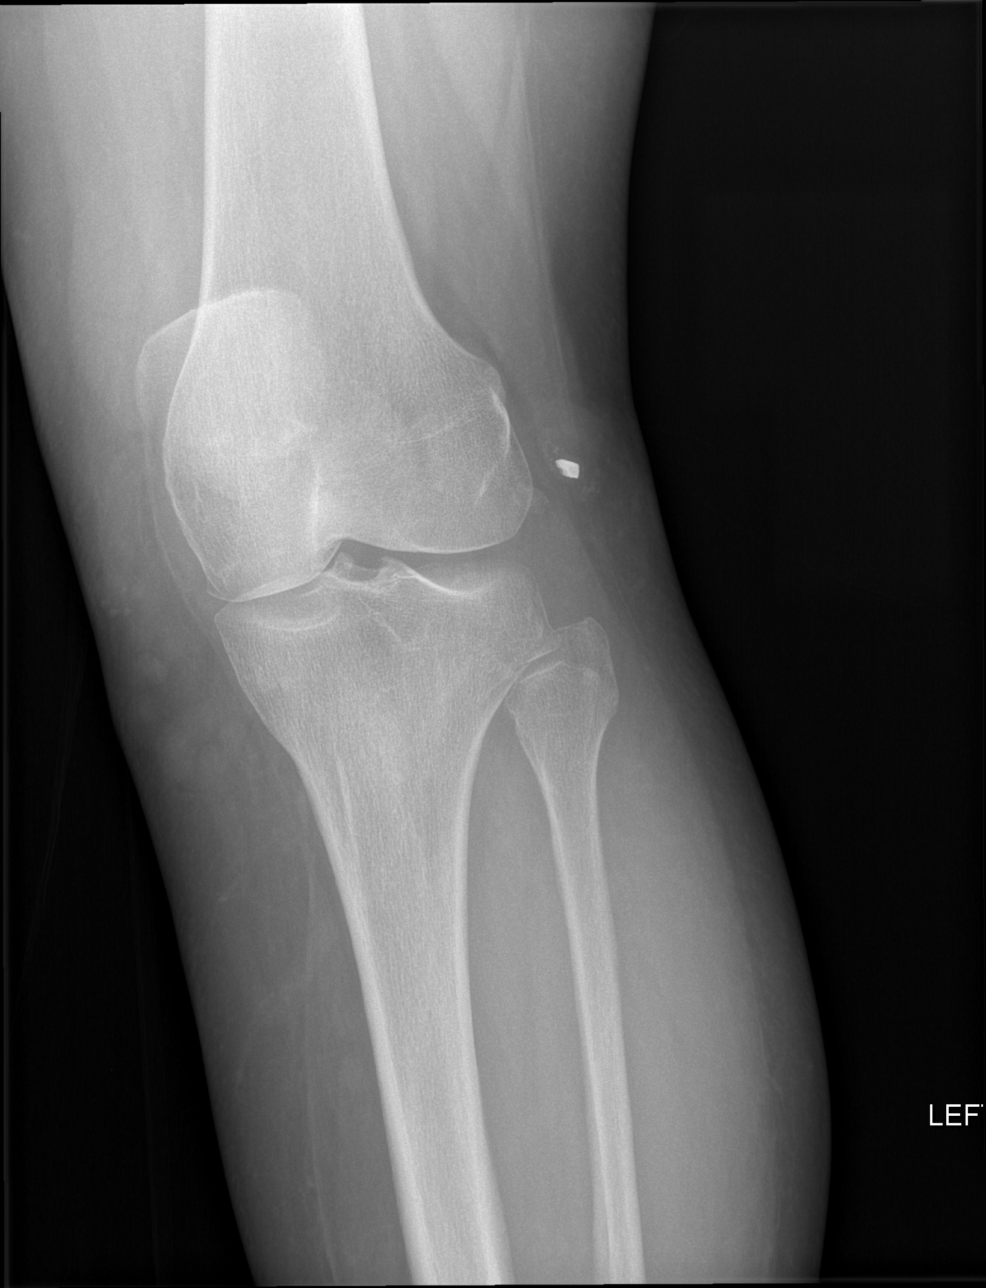

[t knee lat left]
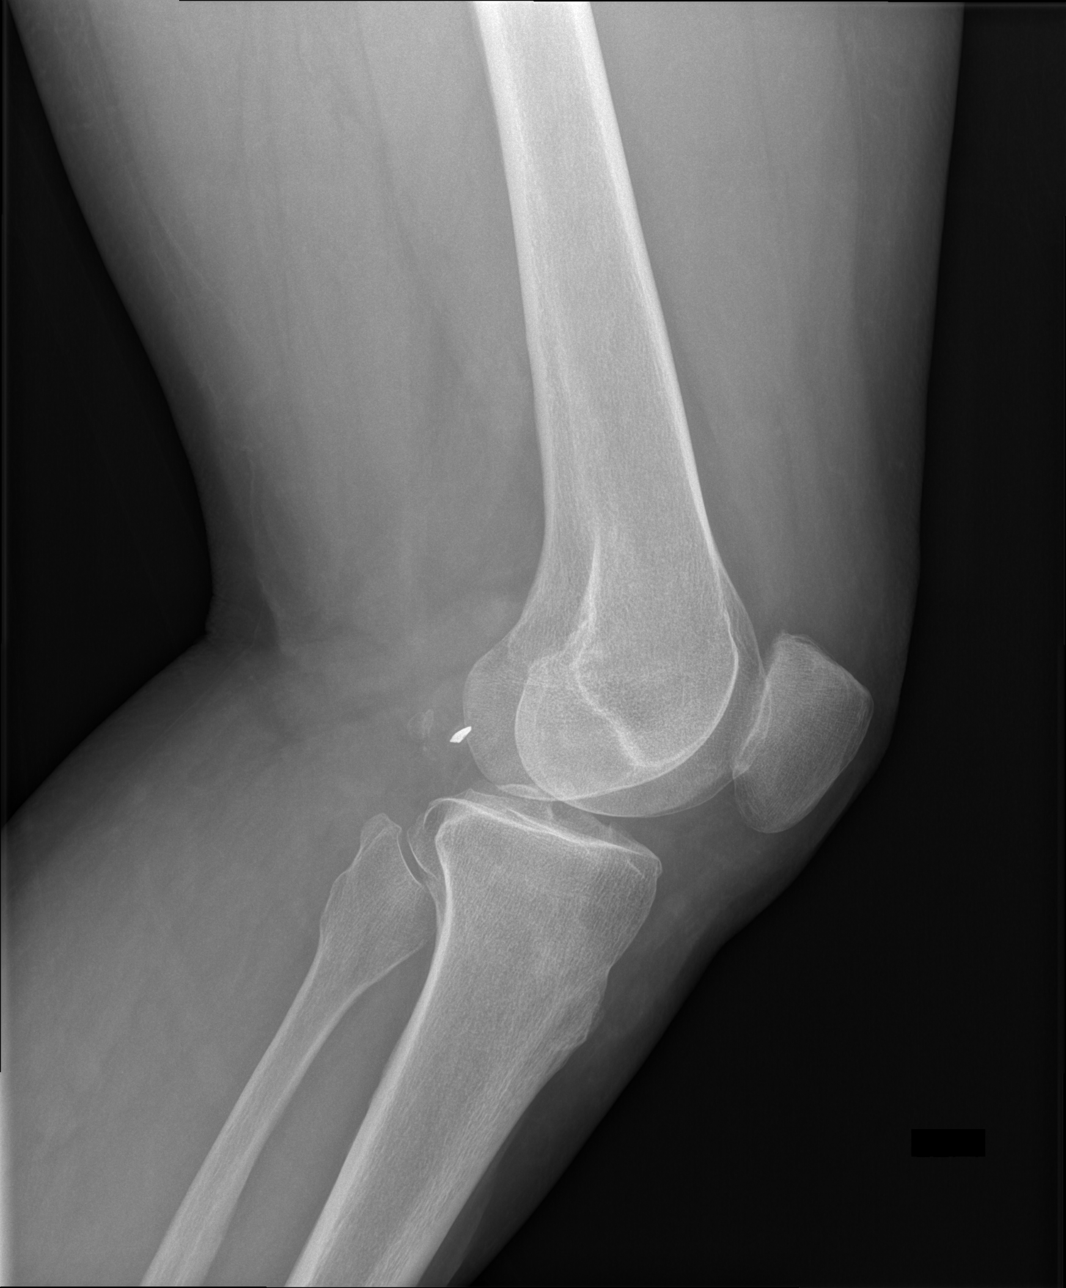

[4 of 4 positions shown; findings below may reference images not displayed]

FINDINGS: No acute osseous or joint abnormality. Small radiopaque structure is
seen along the lateral aspect of the distal femur. Mild medial
compartment osteophytosis.
IMPRESSION: 1. No acute osseous or joint abnormality.
2. Radiopaque structure in the soft tissues lateral to the distal
femur may be pre-existing.
3. Trace medial compartment osteophytosis.

## 2020-07-20 DEATH — deceased
